# Patient Record
Sex: Male | Born: 1975 | Race: Black or African American | Hispanic: No | Marital: Married | Smoking: Never smoker
Health system: Southern US, Community
[De-identification: ages and names within clinical notes are randomized; demographics above are authoritative.]

## PROBLEM LIST (undated history)

## (undated) DIAGNOSIS — J45909 Unspecified asthma, uncomplicated: Secondary | ICD-10-CM

## (undated) DIAGNOSIS — E785 Hyperlipidemia, unspecified: Secondary | ICD-10-CM

## (undated) DIAGNOSIS — M549 Dorsalgia, unspecified: Secondary | ICD-10-CM

## (undated) HISTORY — DX: Hyperlipidemia, unspecified: E78.5

---

## 1998-11-17 ENCOUNTER — Emergency Department (HOSPITAL_COMMUNITY): Admission: EM | Admit: 1998-11-17 | Discharge: 1998-11-17 | Payer: Self-pay | Admitting: Emergency Medicine

## 2000-10-23 ENCOUNTER — Encounter: Admission: RE | Admit: 2000-10-23 | Discharge: 2000-10-23 | Payer: Self-pay | Admitting: Family Medicine

## 2000-10-23 ENCOUNTER — Encounter: Payer: Self-pay | Admitting: Family Medicine

## 2005-04-26 ENCOUNTER — Emergency Department (HOSPITAL_COMMUNITY): Admission: EM | Admit: 2005-04-26 | Discharge: 2005-04-26 | Payer: Self-pay | Admitting: Family Medicine

## 2007-05-19 ENCOUNTER — Emergency Department (HOSPITAL_COMMUNITY): Admission: EM | Admit: 2007-05-19 | Discharge: 2007-05-19 | Payer: Self-pay | Admitting: Emergency Medicine

## 2009-10-18 ENCOUNTER — Emergency Department (HOSPITAL_COMMUNITY): Admission: EM | Admit: 2009-10-18 | Discharge: 2009-10-18 | Payer: Self-pay | Admitting: Family Medicine

## 2010-05-04 LAB — POCT RAPID STREP A (OFFICE): Streptococcus, Group A Screen (Direct): NEGATIVE

## 2011-01-07 ENCOUNTER — Emergency Department (INDEPENDENT_AMBULATORY_CARE_PROVIDER_SITE_OTHER)
Admission: EM | Admit: 2011-01-07 | Discharge: 2011-01-07 | Disposition: A | Discharge status: Home / Self Care | Payer: 59 | Source: Home / Self Care | Attending: Family Medicine | Admitting: Family Medicine

## 2011-01-07 DIAGNOSIS — M542 Cervicalgia: Secondary | ICD-10-CM

## 2011-01-07 DIAGNOSIS — M62838 Other muscle spasm: Secondary | ICD-10-CM

## 2011-01-07 HISTORY — DX: Dorsalgia, unspecified: M54.9

## 2011-01-07 MED ORDER — HYDROCODONE-ACETAMINOPHEN 5-325 MG PO TABS: ORAL_TABLET | ORAL | Status: AC

## 2011-01-07 MED ORDER — CYCLOBENZAPRINE HCL 5 MG PO TABS: 5.0000 mg | ORAL_TABLET | Freq: Three times a day (TID) | ORAL | Status: AC | PRN

## 2011-01-07 MED ORDER — NAPROXEN 500 MG PO TABS: 500.0000 mg | ORAL_TABLET | Freq: Two times a day (BID) | ORAL | Status: AC

## 2011-01-07 NOTE — ED Provider Notes (Signed)
History     CSN: [id number] Arrival date & time: 01/07/2011  3:09 PM   First MD Initiated Contact with Patient 01/07/11 1503      Chief Complaint  Patient presents with  . Torticollis  . Sore Throat    (Consider location/radiation/quality/duration/timing/severity/associated sxs/prior treatment) HPI Comments: Sean Glover presents for evaluation of persistent neck pain, with movement. He denies any injury. He does report a hx of chronic back pain for which he sees a chiropractor, and has regular "adjustments." He also reports some vision changes where he has trouble focusing over the last day or so. He also reports some lightheadedness without syncope. He denies any numbness, tingling, weakness in the upper extremities.   Patient is a 35 y.o. male presenting with pharyngitis and neck injury. The history is provided by the patient.  Sore Throat This is a new problem. The current episode started more than 1 week ago. The problem occurs constantly. The problem has not changed since onset.The symptoms are aggravated by swallowing and eating. The symptoms are relieved by nothing.  Neck Injury This is a new problem. The current episode started more than 1 week ago. The problem occurs constantly. The problem has not changed since onset.He has tried nothing for the symptoms.    Past Medical History  Diagnosis Date  . Back pain     History reviewed. No pertinent past surgical history.  No family history on file.  History  Substance Use Topics  . Smoking status: Never Smoker   . Smokeless tobacco: Not on file  . Alcohol Use: No      Review of Systems  Constitutional: Negative.   HENT: Positive for sore throat, mouth sores, trouble swallowing, neck pain and neck stiffness.   Eyes: Negative.   Respiratory: Negative.   Cardiovascular: Negative.   Gastrointestinal: Negative.   Genitourinary: Negative.   Neurological: Positive for light-headedness. Negative for dizziness, weakness and  numbness.    Allergies  Review of patient's allergies indicates no known allergies.  Home Medications   Current Outpatient Rx  Name Route Sig Dispense Refill  . EXCEDRIN BACK & BODY PO Oral Take by mouth as needed.        BP 146/91  Pulse 67  Temp(Src) 98.4 F (36.9 C) (Oral)  Resp 16  SpO2 100%  Physical Exam  Constitutional: He is oriented to person, place, and time. He appears well-developed and well-nourished.  HENT:  Head: Normocephalic and atraumatic.  Eyes: EOM are normal. Pupils are equal, round, and reactive to light.  Neck: Trachea normal. Muscular tenderness present. No spinous process tenderness present. Decreased range of motion present. No mass and no thyromegaly present.       No tenderness over the trapezius of SCM muscles; mild tenderness to palpation over occipital muscles and at base of skull; decreased range of motion with extension, none with flexion; decreased range of motion with rotation to the LEFT and the RIGHT  Musculoskeletal:       5/5 strength throughout upper extremities  Neurological: He is alert and oriented to person, place, and time.  Skin: Skin is warm and dry.    ED Course  Procedures (including critical care time)  Labs Reviewed - No data to display No results found.   No diagnosis found.    MDM          Richardo Priest, MD 01/07/11 [phone]

## 2011-01-07 NOTE — ED Notes (Signed)
C/o sorethroat for 10 days.  Also c/o pain to rt side of neck and rt shoulder for 11 days.  States he goes to chiropractor once a week normally.  Reports he went for this neck pain and then went to massage therapist.  States initially the massage made it feel better but then it became worse.

## 2011-04-20 ENCOUNTER — Encounter (HOSPITAL_COMMUNITY): Payer: Self-pay

## 2011-04-20 ENCOUNTER — Emergency Department (INDEPENDENT_AMBULATORY_CARE_PROVIDER_SITE_OTHER)
Admission: EM | Admit: 2011-04-20 | Discharge: 2011-04-20 | Disposition: A | Discharge status: Home / Self Care | Payer: 59 | Source: Home / Self Care | Attending: Family Medicine | Admitting: Family Medicine

## 2011-04-20 DIAGNOSIS — J069 Acute upper respiratory infection, unspecified: Secondary | ICD-10-CM

## 2011-04-20 MED ORDER — HYDROCOD POLST-CHLORPHEN POLST 10-8 MG/5ML PO LQCR: 5.0000 mL | Freq: Two times a day (BID) | ORAL | Status: DC

## 2011-04-20 MED ORDER — IPRATROPIUM BROMIDE 0.06 % NA SOLN: 2.0000 | Freq: Four times a day (QID) | NASAL | Status: DC

## 2011-04-20 MED ORDER — AZITHROMYCIN 250 MG PO TABS: ORAL_TABLET | ORAL | Status: AC

## 2011-04-20 NOTE — ED Notes (Signed)
Pt has sorethroat and cough that started three weeks ago.  Cough is productive and worse at hs.

## 2011-04-20 NOTE — Discharge Instructions (Signed)
Drink plenty of fluids as discussed, use medicine as prescribed, and mucinex or delsym for cough. Return or see your doctor if further problems °

## 2011-04-20 NOTE — ED Provider Notes (Signed)
History     CSN: [id number]  Arrival date & time 04/20/11  1438   First MD Initiated Contact with Patient 04/20/11 1450      Chief Complaint  Patient presents with  . Sore Throat    (Consider location/radiation/quality/duration/timing/severity/associated sxs/prior treatment) Patient is a 36 y.o. male presenting with pharyngitis. The history is provided by the patient.  Sore Throat This is a new problem. The current episode started more than 1 week ago (3 wks of persistent sx, nonsmoker, no fever.). The problem occurs constantly (worse at night  and in am.). Pertinent negatives include no chest pain and no shortness of breath.    Past Medical History  Diagnosis Date  . Back pain     History reviewed. No pertinent past surgical history.  History reviewed. No pertinent family history.  History  Substance Use Topics  . Smoking status: Never Smoker   . Smokeless tobacco: Not on file  . Alcohol Use: No      Review of Systems  Constitutional: Negative.   HENT: Positive for congestion, sore throat, rhinorrhea and postnasal drip. Negative for trouble swallowing.   Respiratory: Positive for cough. Negative for shortness of breath.   Cardiovascular: Negative for chest pain.  Gastrointestinal: Negative.   Skin: Negative.     Allergies  Review of patient's allergies indicates no known allergies.  Home Medications   Current Outpatient Rx  Name Route Sig Dispense Refill  . EXCEDRIN BACK & BODY PO Oral Take by mouth as needed.      . AZITHROMYCIN 250 MG PO TABS  Take as directed on pack until finished. 6 each 0  . HYDROCOD POLST-CPM POLST ER 10-8 MG/5ML PO LQCR Oral Take 5 mLs by mouth every 12 (twelve) hours. 115 mL 0  . IPRATROPIUM BROMIDE 0.06 % NA SOLN Nasal Place 2 sprays into the nose 4 (four) times daily. 15 mL 1  . NAPROXEN 500 MG PO TABS Oral Take 1 tablet (500 mg total) by mouth 2 (two) times daily. 30 tablet 0    BP 114/56  Pulse 68  Temp(Src) 98.9 F (37.2  C) (Oral)  Resp 17  SpO2 100%  Physical Exam  Nursing note and vitals reviewed. Constitutional: He appears well-developed and well-nourished.  HENT:  Head: Normocephalic.  Right Ear: External ear normal.  Left Ear: External ear normal.  Nose: Mucosal edema and rhinorrhea present.  Mouth/Throat: Mucous membranes are normal. Posterior oropharyngeal erythema present. No oropharyngeal exudate or posterior oropharyngeal edema.  Cardiovascular: Normal rate, regular rhythm, normal heart sounds and intact distal pulses.   Pulmonary/Chest: Effort normal and breath sounds normal.    ED Course  Procedures (including critical care time)  Labs Reviewed - No data to display No results found.   1. URI (upper respiratory infection)       MDM          Barkley Bruns, MD 04/20/11 [phone]

## 2013-05-12 ENCOUNTER — Encounter (HOSPITAL_COMMUNITY): Payer: Self-pay | Admitting: Emergency Medicine

## 2013-05-12 ENCOUNTER — Emergency Department (HOSPITAL_COMMUNITY)
Admission: EM | Admit: 2013-05-12 | Discharge: 2013-05-12 | Disposition: A | Discharge status: Home / Self Care | Payer: 59 | Source: Home / Self Care | Attending: Family Medicine | Admitting: Family Medicine

## 2013-05-12 DIAGNOSIS — J00 Acute nasopharyngitis [common cold]: Secondary | ICD-10-CM

## 2013-05-12 HISTORY — DX: Unspecified asthma, uncomplicated: J45.909

## 2013-05-12 LAB — POCT RAPID STREP A: Streptococcus, Group A Screen (Direct): NEGATIVE

## 2013-05-12 NOTE — ED Provider Notes (Signed)
CSN: [id number]     Arrival date & time 05/12/13  0917 History   First MD Initiated Contact with Patient 05/12/13 0945     Chief Complaint  Patient presents with  . Sore Throat   (Consider location/radiation/quality/duration/timing/severity/associated sxs/prior Treatment) Patient is a 38 y.o. male presenting with pharyngitis and URI. The history is provided by the patient.  Sore Throat This is a new problem. Episode onset: 3 days. The problem occurs constantly. The problem has not changed since onset.Pertinent negatives include no chest pain, no abdominal pain, no headaches and no shortness of breath. The symptoms are aggravated by swallowing.  URI Presenting symptoms: congestion, cough, rhinorrhea and sore throat   Severity:  Mild Onset quality:  Gradual Duration:  3 days Timing:  Constant Progression:  Unchanged Chronicity:  New Associated symptoms: sneezing   Associated symptoms: no headaches     Past Medical History  Diagnosis Date  . Back pain   . Asthma    History reviewed. No pertinent past surgical history. No family history on file. History  Substance Use Topics  . Smoking status: Never Smoker   . Smokeless tobacco: Not on file  . Alcohol Use: No    Review of Systems  HENT: Positive for congestion, rhinorrhea, sneezing and sore throat.   Respiratory: Positive for cough. Negative for shortness of breath.   Cardiovascular: Negative for chest pain.  Gastrointestinal: Negative for abdominal pain.  Neurological: Negative for headaches.  All other systems reviewed and are negative.    Allergies  Review of patient's allergies indicates no known allergies.  Home Medications   Current Outpatient Rx  Name  Route  Sig  Dispense  Refill  . Acetaminophen-Aspirin Buffered (EXCEDRIN BACK & BODY PO)   Oral   Take by mouth as needed.           . chlorpheniramine-HYDROcodone (TUSSIONEX PENNKINETIC ER) 10-8 MG/5ML LQCR   Oral   Take 5 mLs by mouth every 12 (twelve)  hours.   115 mL   0   . EXPIRED: ipratropium (ATROVENT) 0.06 % nasal spray   Nasal   Place 2 sprays into the nose 4 (four) times daily.   15 mL   1    BP 118/77  Pulse 80  Temp(Src) 98.6 F (37 C) (Oral)  Resp 16  SpO2 100% Physical Exam  Nursing note and vitals reviewed. Constitutional: He is oriented to person, place, and time. He appears well-developed.  HENT:  Head: Normocephalic and atraumatic.  Right Ear: Hearing, tympanic membrane, external ear and ear canal normal.  Left Ear: Hearing, tympanic membrane, external ear and ear canal normal.  Nose: Nose normal.  Mouth/Throat: Uvula is midline, oropharynx is clear and moist and mucous membranes are normal.  Eyes: Conjunctivae are normal. Right eye exhibits no discharge. Left eye exhibits no discharge. No scleral icterus.  Neck: Normal range of motion. Neck supple.  Cardiovascular: Normal rate, regular rhythm and normal heart sounds.   Pulmonary/Chest: Effort normal and breath sounds normal.  Musculoskeletal: Normal range of motion.  Lymphadenopathy:    He has no cervical adenopathy.  Neurological: He is alert and oriented to person, place, and time.  Skin: Skin is warm and dry.  Psychiatric: He has a normal mood and affect. His behavior is normal.    ED Course  Procedures (including critical care time) Labs Review Labs Reviewed  POCT RAPID STREP A ([facility] CARE ONLY)   Imaging Review No results found.   MDM   1.  Common cold    Symptomatic care at home. Rapid strep negative.    Jess Barters Seltzer, [place] 05/12/13 [phone]

## 2013-05-12 NOTE — ED Notes (Signed)
Sore throat for 3 days.  Patient has been using throat spray.  Patient concerned for a strep infection versus allergies.  Patient's daughter has been recently discharged from hospital after admission for "strep in kidneys"

## 2013-05-12 NOTE — Discharge Instructions (Signed)
Rapid step screen was negative

## 2013-05-13 NOTE — ED Provider Notes (Signed)
Medical screening examination/treatment/procedure(s) were performed by a resident physician or non-physician practitioner and as the supervising physician I was immediately available for consultation/collaboration.  Clementeen GrahamEvan Corey, MD    Rodolph BongEvan S Corey, MD 05/13/13 [phone]

## 2013-05-14 LAB — CULTURE, GROUP A STREP

## 2013-10-21 ENCOUNTER — Ambulatory Visit (INDEPENDENT_AMBULATORY_CARE_PROVIDER_SITE_OTHER): Payer: 59 | Admitting: Emergency Medicine

## 2013-10-21 VITALS — BP 120/74 | HR 57 | Temp 98.3°F | Resp 16 | Ht 70.0 in | Wt 176.0 lb

## 2013-10-21 DIAGNOSIS — G5682 Other specified mononeuropathies of left upper limb: Secondary | ICD-10-CM

## 2013-10-21 DIAGNOSIS — G5680 Other specified mononeuropathies of unspecified upper limb: Secondary | ICD-10-CM

## 2013-10-21 DIAGNOSIS — J018 Other acute sinusitis: Secondary | ICD-10-CM

## 2013-10-21 MED ORDER — AMOXICILLIN-POT CLAVULANATE 875-125 MG PO TABS: 1.0000 | ORAL_TABLET | Freq: Two times a day (BID) | ORAL | Status: DC

## 2013-10-21 MED ORDER — ACETAMINOPHEN-CODEINE #3 300-30 MG PO TABS: 1.0000 | ORAL_TABLET | ORAL | Status: DC | PRN

## 2013-10-21 MED ORDER — CYCLOBENZAPRINE HCL 10 MG PO TABS: 10.0000 mg | ORAL_TABLET | Freq: Three times a day (TID) | ORAL | Status: DC | PRN

## 2013-10-21 MED ORDER — PSEUDOEPHEDRINE-GUAIFENESIN ER 60-600 MG PO TB12: 1.0000 | ORAL_TABLET | Freq: Two times a day (BID) | ORAL | Status: AC

## 2013-10-21 MED ORDER — NAPROXEN SODIUM 550 MG PO TABS: 550.0000 mg | ORAL_TABLET | Freq: Two times a day (BID) | ORAL | Status: AC

## 2013-10-21 NOTE — Addendum Note (Signed)
Addended by: Johnnette Litter on: 10/21/2013 07:38 PM   Modules accepted: Orders

## 2013-10-21 NOTE — Patient Instructions (Signed)

## 2013-10-21 NOTE — Progress Notes (Signed)
[facility] [address], [place] [address] [phone]  Date:  10/21/2013   Name:  Sean Glover   DOB:  [birth year]   MRN:  [id number]  PCP:  No Pcp Per Pt    Chief Complaint: Back Pain and Sore Throat   History of Present Illness:  Sean Glover is a 38 y.o. very pleasant male patient who presents with the following:  Ill for a week with nasal congestion and post nasal drainage.  Sore throat.  Has a mucopurulent nasal discharge.  No fever or chills\ No cough, wheezing or shortness of breath.   Moved a bedroom set of furniture around on Saturday morning.  By Monday the pain was bad and it has progressively worsened. No direct injury. No neuro symptoms or radiation of pain.   No improvement after chiropractic manipulation. No improvement with over the counter medications or other home remedies.  Denies other complaint or health concern today.  There are no active problems to display for this patient.   Past Medical History  Diagnosis Date  . Back pain   . Asthma     History reviewed. No pertinent past surgical history.  History  Substance Use Topics  . Smoking status: Never Smoker   . Smokeless tobacco: Not on file  . Alcohol Use: No    Family History  Problem Relation Age of Onset  . Heart disease Father   . Diabetes Sister     No Known Allergies  Medication list has been reviewed and updated.  Current Outpatient Prescriptions on File Prior to Visit  Medication Sig Dispense Refill  . Acetaminophen-Aspirin Buffered (EXCEDRIN BACK & BODY PO) Take by mouth as needed.        . chlorpheniramine-HYDROcodone (TUSSIONEX PENNKINETIC ER) 10-8 MG/5ML LQCR Take 5 mLs by mouth every 12 (twelve) hours.  115 mL  0  . ipratropium (ATROVENT) 0.06 % nasal spray Place 2 sprays into the nose 4 (four) times daily.  15 mL  1   No current facility-administered medications on file prior to visit.    Review of Systems:  As per HPI, otherwise negative.     Physical Examination: Filed Vitals:   10/21/13 1901  BP: 120/74  Pulse: 57  Temp: 98.3 F (36.8 C)  Resp: 16   Filed Vitals:   10/21/13 1901  Height:  (1.778 m)  Weight: 176 lb (79.833 kg)   Body mass index is 25.25 kg/(m^2). Ideal Body Weight: Weight in (lb) to have BMI = 25: 173.9  GEN: WDWN, NAD, Non-toxic, A & O x 3 HEENT: Atraumatic, Normocephalic. Neck supple. No masses, No LAD. Ears and Nose: No external deformity. CV: RRR, No M/G/R. No JVD. No thrill. No extra heart sounds. PULM: CTA B, no wheezes, crackles, rhonchi. No retractions. No resp. distress. No accessory muscle use. ABD: S, NT, ND, +BS. No rebound. No HSM. EXTR: No c/c/e  Left tenderness medial and superior to angle of scapula  NEURO Normal gait.  PSYCH: Normally interactive. Conversant. Not depressed or anxious appearing.  Calm demeanor.   Assessment and Plan: Scapulocostal syndrome Anaprox Flexeril tyl #3 Sinusitis augmentin mucinex d  Signed,  Phillips Odor, MD

## 2014-04-14 ENCOUNTER — Encounter: Payer: 59 | Attending: Family Medicine | Admitting: *Deleted

## 2014-04-14 ENCOUNTER — Encounter: Payer: Self-pay | Admitting: *Deleted

## 2014-04-14 DIAGNOSIS — Z713 Dietary counseling and surveillance: Secondary | ICD-10-CM | POA: Diagnosis not present

## 2014-04-14 DIAGNOSIS — E785 Hyperlipidemia, unspecified: Secondary | ICD-10-CM | POA: Insufficient documentation

## 2014-04-14 NOTE — Progress Notes (Signed)
  Medical Nutrition Therapy:  Appt start time: 0900 end time:  1000.   Assessment:  Primary concerns today: Moosa is here with his wife for nutrition counseling pertaining to hyperlipidemia.  His total cholesterol is 207 mg/dl.  I met with his wife independently yesterday to discuss her prediabetes.   Saifullah's wife does the cooking and grocery shopping.  However, neither one of them actually eats much at home.  They both skip a lot of meals and if he eats, many times Vasil eats out.   He skips meals every day and admits to binge eating once a week: whole box cookies, 7 doughtnuts or whole cake   Preferred Learning Style:   Auditory  Visual   Learning Readiness:  Ready   MEDICATIONS: none   DIETARY INTAKE:  Usual eating pattern includes 1-2 meals and 0-1 snacks per day.  Everyday foods include proteins, starches.  Avoided foods include none.    24-hr recall:  B ( AM): bowl oatmeal with brown sugar (instant); might get bacon platter with eggs, 1/2 biscuit; raisin bran and apple.  Skips 2 times/week  Snk ( AM): not usually  L ( PM): skips Snk ( PM): not usually D ( PM): cookout (grilled chicken sandwich and fries); might eat cereal or chip and cookies at home Snk ( PM): not usually Beverages: water, pink lemonade  Usual physical activity: work out with wife- work out at gym 3-4 days/week.  1 mile on treadmill and lifting weights (60 minutes)  Estimated energy needs: 2400-2600 calories    Nutritional Diagnosis:  [place]-2.2 Altered nutrition-related laboratory As related to fats and fiber.  As evidenced by elevated cholesterol.    Intervention:  Nutrition counseling provided.  Discussed health-healthy recommendations such as reducing saturated and trans fats and increasing unsaturated fats and fiber from whole grains, fruits/vegetables, nuts/seeds, and beans/legumes.  Recommended cardiovascular exercise more than weight lifting exercise and recommended 3 meals/day plus snacks  instead of just 2 meals.  Discussed importance of meal planning for both xachary hambly and explained how their dietary recommendations overlap and how they can make similar changes for both of their health  Teaching Method Utilized:  Visual Auditory Hands on  Handouts given during visit include:  Heart Health medical nutrition therapy recommendations  Barriers to learning/adherence to lifestyle change: making meals a priority  Demonstrated degree of understanding via:  Teach Back   Monitoring/Evaluation:  Dietary intake, exercise, labs, and body weight prn.

## 2014-05-02 ENCOUNTER — Ambulatory Visit: Payer: 59 | Admitting: Skilled Nursing Facility1

## 2014-07-13 ENCOUNTER — Encounter (HOSPITAL_COMMUNITY): Payer: Self-pay | Admitting: Emergency Medicine

## 2014-07-13 ENCOUNTER — Emergency Department (HOSPITAL_COMMUNITY)
Admission: EM | Admit: 2014-07-13 | Discharge: 2014-07-14 | Disposition: A | Discharge status: Home / Self Care | Payer: Medicaid Other | Attending: Emergency Medicine | Admitting: Emergency Medicine

## 2014-07-13 DIAGNOSIS — J45909 Unspecified asthma, uncomplicated: Secondary | ICD-10-CM | POA: Insufficient documentation

## 2014-07-13 DIAGNOSIS — R1312 Dysphagia, oropharyngeal phase: Secondary | ICD-10-CM | POA: Diagnosis not present

## 2014-07-13 DIAGNOSIS — R070 Pain in throat: Secondary | ICD-10-CM | POA: Diagnosis present

## 2014-07-13 DIAGNOSIS — Z791 Long term (current) use of non-steroidal anti-inflammatories (NSAID): Secondary | ICD-10-CM | POA: Insufficient documentation

## 2014-07-13 DIAGNOSIS — R630 Anorexia: Secondary | ICD-10-CM | POA: Diagnosis not present

## 2014-07-13 DIAGNOSIS — Z880 Allergy status to penicillin: Secondary | ICD-10-CM | POA: Insufficient documentation

## 2014-07-13 DIAGNOSIS — Z8639 Personal history of other endocrine, nutritional and metabolic disease: Secondary | ICD-10-CM | POA: Insufficient documentation

## 2014-07-13 DIAGNOSIS — J029 Acute pharyngitis, unspecified: Secondary | ICD-10-CM | POA: Insufficient documentation

## 2014-07-13 DIAGNOSIS — Z792 Long term (current) use of antibiotics: Secondary | ICD-10-CM | POA: Insufficient documentation

## 2014-07-13 DIAGNOSIS — Z79899 Other long term (current) drug therapy: Secondary | ICD-10-CM | POA: Insufficient documentation

## 2014-07-13 NOTE — ED Notes (Signed)
Pt states earlier today he was bleeding in his throat  Pt states it was a moderate amt of blood  Pt denies injury  Pt states his throat has been uncomfortable for the past couple of weeks   Not actively bleeding at this time

## 2014-07-13 NOTE — ED Provider Notes (Signed)
CSN: [id number]     Arrival date & time 07/13/14  2145 History  This chart was scribed for non-physician provider Elpidio AnisShari Jemari Hallum, PA-C, working with Derwood KaplanAnkit Nanavati, MD, by Phillis HaggisGabriella Gaje, ED Scribe. This patient was seen in room WTR9/WTR9 and patient care was started at 11:48 PM.   Chief Complaint  Patient presents with  . throat bleeding    The history is provided by the patient. No language interpreter was used.  HPI Comments: Sean Glover is a 39 y.o. male who presents to the Emergency Department complaining of throat pain onset 9 hours ago. He states that he has noticed blood in his throat as well. He states that it was consistently bleeding for 2 hours and stopped bleeding about 5 PM. He states that his throat continues to feel very irritated. He states that he was seen by his PCP in November and was diagnosed with a sinus problem and states that he always feels like he has phlegm in his throat; he states that he has continued to be told that this was allergy related. He states that there is discomfort with eating and drinking, and states that he has not eaten or drank much water today. He denies sneezing, indigestion, trouble swallowing, or fever. Patient denies any injury to the throat.   Past Medical History  Diagnosis Date  . Back pain   . Asthma   . Hyperlipidemia    History reviewed. No pertinent past surgical history. Family History  Problem Relation Age of Onset  . Heart disease Father   . Diabetes Sister   . Hypertension Sister   . Stroke Mother    History  Substance Use Topics  . Smoking status: Never Smoker   . Smokeless tobacco: Not on file  . Alcohol Use: No    Review of Systems  Constitutional: Positive for appetite change. Negative for fever and chills.  HENT: Positive for congestion and sore throat. Negative for sneezing and trouble swallowing.    Allergies  Penicillins  Home Medications   Prior to Admission medications   Medication Sig Start Date End  Date Taking? Authorizing Provider  Acetaminophen-Aspirin Buffered (EXCEDRIN BACK & BODY PO) Take by mouth as needed.      Historical Provider, MD  acetaminophen-codeine (TYLENOL #3) 300-30 MG per tablet Take 1-2 tablets by mouth every 4 (four) hours as needed. Patient not taking: Reported on 04/14/2014 10/21/13   Carmelina DaneJeffery S Anderson, MD  amoxicillin-clavulanate (AUGMENTIN) 875-125 MG per tablet Take 1 tablet by mouth 2 (two) times daily. Patient not taking: Reported on 04/14/2014 10/21/13   Carmelina DaneJeffery S Anderson, MD  chlorpheniramine-HYDROcodone [facility](TUSSIONEX PENNKINETIC ER) 10-8 MG/5ML [facility] Take 5 mLs by mouth every 12 (twelve) hours. Patient not taking: Reported on 04/14/2014 04/20/11   Linna HoffJames D Kindl, MD  CREATINE PO Take by mouth.    Historical Provider, MD  cyclobenzaprine (FLEXERIL) 10 MG tablet Take 1 tablet (10 mg total) by mouth 3 (three) times daily as needed for muscle spasms. Patient not taking: Reported on 04/14/2014 10/21/13   Carmelina DaneJeffery S Anderson, MD  ipratropium (ATROVENT) 0.06 % nasal spray Place 2 sprays into the nose 4 (four) times daily. 04/20/11 04/19/12  Linna HoffJames D Kindl, MD  Multiple Vitamin (MULTIVITAMIN) tablet Take 1 tablet by mouth daily.    Historical Provider, MD  naproxen sodium (ANAPROX DS) 550 MG tablet Take 1 tablet (550 mg total) by mouth 2 (two) times daily with a meal. Patient not taking: Reported on 04/14/2014 10/21/13 10/21/14  Carmelina DaneJeffery S Anderson,  MD  pseudoephedrine-guaifenesin (MUCINEX D) 60-600 MG per tablet Take 1 tablet by mouth every 12 (twelve) hours. Patient not taking: Reported on 04/14/2014 10/21/13 10/21/14  Carmelina Dane, MD  psyllium (REGULOID) 0.52 G capsule Take 0.52 g by mouth daily.    Historical Provider, MD   BP 130/57 mmHg  Pulse 70  Temp(Src) 98.2 F (36.8 C) (Oral)  Resp 15  Ht  (1.803 m)  Wt 175 lb (79.379 kg)  BMI 24.42 kg/m2  SpO2 100%  Physical Exam  Constitutional: He is oriented to person, place, and time. He appears well-developed and  well-nourished. No distress.  HENT:  Head: Normocephalic and atraumatic.  Mouth/Throat: Oropharynx is clear and moist.  Eyes: Conjunctivae and EOM are normal.  Neck: Normal range of motion. Neck supple. No thyromegaly present.  Cardiovascular: Normal rate, regular rhythm and normal heart sounds.   Pulmonary/Chest: Effort normal and breath sounds normal.  Musculoskeletal: Normal range of motion. He exhibits no edema.  Neurological: He is alert and oriented to person, place, and time.  Skin: Skin is warm and dry.  Psychiatric: He has a normal mood and affect. His behavior is normal.  Nursing note and vitals reviewed.   ED Course  Procedures (including critical care time) DIAGNOSTIC STUDIES: Oxygen Saturation is 100% on room air, normal by my interpretation.    COORDINATION OF CARE: 11:52 PM-Discussed treatment plan which includes referral to ENT with pt at bedside and pt agreed to plan.   Labs Review Labs Reviewed - No data to display  Imaging Review No results found.   EKG Interpretation None      MDM   Final diagnoses:  None   1. Dysphagia  Exam is unremarkable, but with history of significant bleeding from the throat will refer to ENT for indepth inspection and evaluation.   I personally performed the services described in this documentation, which was scribed in my presence. The recorded information has been reviewed and is accurate.     Elpidio Anis, PA-C 07/15/14 [id number]  Rolan Bucco, MD 07/18/14 [phone]

## 2014-07-14 NOTE — Discharge Instructions (Signed)
CALL DR. BYERS TO SCHEDULE AN APPOINTMENT FOR FURTHER EVALUATION OF BLEEDING FROM THE THROAT. RETURN TO THE EMERGENCY DEPARTMENT IF SYMPTOMS RECUR.

## 2016-06-10 ENCOUNTER — Encounter: Payer: Self-pay | Admitting: Physician Assistant

## 2016-06-11 ENCOUNTER — Encounter (HOSPITAL_COMMUNITY): Payer: Self-pay | Admitting: *Deleted

## 2016-06-11 ENCOUNTER — Emergency Department (HOSPITAL_COMMUNITY)
Admission: EM | Admit: 2016-06-11 | Discharge: 2016-06-11 | Disposition: A | Discharge status: Home / Self Care | Payer: BLUE CROSS/BLUE SHIELD | Attending: Emergency Medicine | Admitting: Emergency Medicine

## 2016-06-11 ENCOUNTER — Emergency Department (HOSPITAL_COMMUNITY): Payer: BLUE CROSS/BLUE SHIELD

## 2016-06-11 ENCOUNTER — Encounter: Payer: Self-pay | Admitting: Physician Assistant

## 2016-06-11 ENCOUNTER — Telehealth: Payer: Self-pay | Admitting: *Deleted

## 2016-06-11 ENCOUNTER — Ambulatory Visit (INDEPENDENT_AMBULATORY_CARE_PROVIDER_SITE_OTHER): Payer: BLUE CROSS/BLUE SHIELD | Admitting: Physician Assistant

## 2016-06-11 ENCOUNTER — Encounter (INDEPENDENT_AMBULATORY_CARE_PROVIDER_SITE_OTHER): Payer: Self-pay

## 2016-06-11 VITALS — BP 134/80 | HR 61 | Ht 71.0 in | Wt 184.4 lb

## 2016-06-11 DIAGNOSIS — R0789 Other chest pain: Secondary | ICD-10-CM

## 2016-06-11 DIAGNOSIS — R9431 Abnormal electrocardiogram [ECG] [EKG]: Secondary | ICD-10-CM

## 2016-06-11 DIAGNOSIS — R0600 Dyspnea, unspecified: Secondary | ICD-10-CM

## 2016-06-11 DIAGNOSIS — R0609 Other forms of dyspnea: Secondary | ICD-10-CM | POA: Diagnosis not present

## 2016-06-11 DIAGNOSIS — J45909 Unspecified asthma, uncomplicated: Secondary | ICD-10-CM | POA: Insufficient documentation

## 2016-06-11 DIAGNOSIS — R0602 Shortness of breath: Secondary | ICD-10-CM

## 2016-06-11 DIAGNOSIS — R7989 Other specified abnormal findings of blood chemistry: Secondary | ICD-10-CM

## 2016-06-11 LAB — CBC
HCT: 41 % (ref 39.0–52.0)
Hemoglobin: 13.7 g/dL (ref 13.0–17.0)
MCH: 25.3 pg — AB (ref 26.0–34.0)
MCHC: 33.4 g/dL (ref 30.0–36.0)
MCV: 75.6 fL — ABNORMAL LOW (ref 78.0–100.0)
Platelets: 280 10*3/uL (ref 150–400)
RBC: 5.42 MIL/uL (ref 4.22–5.81)
RDW: 13.4 % (ref 11.5–15.5)
WBC: 9.8 10*3/uL (ref 4.0–10.5)

## 2016-06-11 LAB — BASIC METABOLIC PANEL
ANION GAP: 9 (ref 5–15)
BUN: 10 mg/dL (ref 6–20)
CALCIUM: 9.1 mg/dL (ref 8.9–10.3)
CO2: 24 mmol/L (ref 22–32)
CREATININE: 1.01 mg/dL (ref 0.61–1.24)
Chloride: 105 mmol/L (ref 101–111)
GLUCOSE: 86 mg/dL (ref 65–99)
Potassium: 3.4 mmol/L — ABNORMAL LOW (ref 3.5–5.1)
Sodium: 138 mmol/L (ref 135–145)

## 2016-06-11 LAB — I-STAT TROPONIN, ED: TROPONIN I, POC: 0 ng/mL (ref 0.00–0.08)

## 2016-06-11 LAB — D-DIMER, QUANTITATIVE (NOT AT ARMC): D-DIMER: 0.59 mg{FEU}/L — AB (ref 0.00–0.49)

## 2016-06-11 MED ORDER — SODIUM CHLORIDE 0.9 % IV SOLN
INTRAVENOUS | Status: DC
  Administered 2016-06-11: 22:00:00 via INTRAVENOUS

## 2016-06-11 MED ORDER — IOPAMIDOL (ISOVUE-370) INJECTION 76%
INTRAVENOUS | Status: AC
  Administered 2016-06-11: 100 mL
  Filled 2016-06-11: qty 100

## 2016-06-11 NOTE — Telephone Encounter (Signed)
Pt notified of elevated D-dimer results from earlier today. Pt has been advised per Chelsea Aus, PA he will need to have a Chest CT-A to r/o PE. Pt aware CT dept will call him and schedule test to be done today or tomorrow. Pt is agreeable to plan of care.

## 2016-06-11 NOTE — ED Provider Notes (Signed)
[facility] Provider Note   CSN: [id number] Arrival date & time: 06/11/16  1657     History   Chief Complaint Chief Complaint  Patient presents with  . Shortness of Breath    HPI Sean Glover is a 41 y.o. male.  HPI The patient was sent by the cardiologist office to be evaluated for pulmonary embolism.  Patient was seen and in the left lower heart care office today for evaluation of chest tightness and shortness of breath.  Patient started having symptoms about 3 weeks ago. This started after a drive back from [facility]. Patient denies any trouble with leg swelling. No calf pain. Patient has not been able to do his usual exercise since coming back from this vacation. He had outpatient laboratory tests performed at the cardiologist office. A D-dimer test was sent. This was elevated 0.59. Patient was sent to the emergency room to have a CT scan.  Patient denies any chest pain or shortness of breath currently. Past Medical History:  Diagnosis Date  . Asthma   . Back pain   . Hyperlipidemia     There are no active problems to display for this patient.   History reviewed. No pertinent surgical history.     Home Medications    Prior to Admission medications   Not on File    Family History Family History  Problem Relation Age of Onset  . Diabetes Sister   . Stroke Mother   . Colon polyps Mother   . Healthy Sister   . Diabetes Father   . Healthy Sister     Social History Social History  Substance Use Topics  . Smoking status: Never Smoker  . Smokeless tobacco: Never Used  . Alcohol use No     Allergies   Penicillins   Review of Systems Review of Systems  All other systems reviewed and are negative.    Physical Exam Updated Vital Signs BP 133/71   Pulse 100   Temp 98.5 F (36.9 C) (Oral)   Resp 19   SpO2 100%   Physical Exam  Constitutional: He appears well-developed and well-nourished. No distress.  HENT:  Head: Normocephalic and  atraumatic.  Right Ear: External ear normal.  Left Ear: External ear normal.  Eyes: Conjunctivae are normal. Right eye exhibits no discharge. Left eye exhibits no discharge. No scleral icterus.  Neck: Neck supple. No tracheal deviation present.  Cardiovascular: Normal rate, regular rhythm and intact distal pulses.   Pulmonary/Chest: Effort normal and breath sounds normal. No stridor. No respiratory distress. He has no wheezes. He has no rales.  Abdominal: Soft. Bowel sounds are normal. He exhibits no distension. There is no tenderness. There is no rebound and no guarding.  Musculoskeletal: He exhibits no edema or tenderness.  Neurological: He is alert. He has normal strength. No cranial nerve deficit (no facial droop, extraocular movements intact, no slurred speech) or sensory deficit. He exhibits normal muscle tone. He displays no seizure activity. Coordination normal.  Skin: Skin is warm and dry. No rash noted.  Psychiatric: He has a normal mood and affect.  Nursing note and vitals reviewed.    ED Treatments / Results  Labs (all labs ordered are listed, but only abnormal results are displayed) Labs Reviewed  BASIC METABOLIC PANEL - Abnormal; Notable for the following:       Result Value   Potassium 3.4 (*)    All other components within normal limits  CBC - Abnormal; Notable for the following:  MCV 75.6 (*)    MCH 25.3 (*)    All other components within normal limits  I-STAT TROPOININ, ED     Radiology Ct Angio Chest Pe W And/or Wo Contrast  Result Date: 06/11/2016 CLINICAL DATA:  41 year old male with shortness of breath x2 days and elevated D-dimer. EXAM: CT ANGIOGRAPHY CHEST WITH CONTRAST TECHNIQUE: Multidetector CT imaging of the chest was performed using the standard protocol during bolus administration of intravenous contrast. Multiplanar CT image reconstructions and MIPs were obtained to evaluate the vascular anatomy. CONTRAST:  100 cc Isovue 370 COMPARISON:  None.  FINDINGS: Cardiovascular: There is no cardiomegaly or pericardial effusion. The thoracic aorta appears unremarkable. The origins of the great vessels of the aortic arch appear patent. Evaluation of the pulmonary artery is is limited due to suboptimal opacification and timing of contrast. No definite central pulmonary artery embolus identified. Mediastinum/Nodes: There is no hilar or mediastinal adenopathy. Residual thymic tissue noted in the anterior mediastinum. The esophagus and the thyroid gland are grossly unremarkable. Lungs/Pleura: There is mild prominence of the posterior subpleural fat versus less likely a trace bilateral pleural effusions.The lungs are clear. There is no pneumothorax. The central airways are patent. Upper Abdomen: No acute abnormality. Musculoskeletal: No chest wall abnormality. No acute or significant osseous findings. Review of the MIP images confirms the above findings. IMPRESSION: 1. No CT evidence of central pulmonary artery embolus. 2. Prominent posterior pleural fat versus less likely trace pleural effusions. Electronically Signed   By: Elgie Collard M.D.   On: 06/11/2016 22:29    Procedures Procedures (including critical care time)  Medications Ordered in ED Medications  0.9 %  sodium chloride infusion ( Intravenous New Bag/Given 06/11/16 2146)  iopamidol (ISOVUE-370) 76 % injection (100 mLs  Contrast Given 06/11/16 2159)     Initial Impression / Assessment and Plan / ED Course  I have reviewed the triage vital signs and the nursing notes.  Pertinent labs & imaging results that were available during my care of the patient were reviewed by me and considered in my medical decision making (see chart for details).  Clinical Course as of Jun 11 2305  Tue Jun 11, 2016  2135 Laboratory tests reviewed. CT scan of the chest ordered.  [JK]    Clinical Course User Index [JK] Linwood Dibbles, MD    CT scan is negative for PE.  Labs are unremarkable.  Pt has plans to follow  up with cardiology for outpatient stress test.  Final Clinical Impressions(s) / ED Diagnoses   Final diagnoses:  Dyspnea on exertion    New Prescriptions New Prescriptions   No medications on file     Linwood Dibbles, MD 06/11/16 2307

## 2016-06-11 NOTE — Progress Notes (Addendum)
Cardiology Office Note    Date:  06/11/2016   ID:  Landin, [place] Jan 09, 1976, MRN [id number]  PCP:  Lupe Carney, MD  Cardiologist:  New   Chief Complaint: Chest tightness and shortness of breath.   History of Present Illness:   Sean Glover is a 41 y.o. male with a history of asthma and hyperlipidemia referred by primary care provider  Lupe Carney, MD @ [facility]) for chest tightness with shortness of breath.  Patient was in usual state of health up until 3 weeks ago when he started to having shortness of breath and left-sided chest tightness after he drive back from [place] ruled out. It was 14 hour drip with only one stopped for few minutes. He has a constant left-sided chest pressure which exacerbates by taking deep breath and laying down. No fever, chills, syncope, dizziness, lower extremity edema, palpitation, melena or blood in his stool or urine. No calf or leg pain.  He used to exercise 3-4 times per week. He was doing cardio for 10-15 minutes and lifting afterwards. He hasn't taken any exertional activity/exercise since he came back from vacation. Denies any history of tobacco or illicit drug abuse. No cardiac history as a child. Family history of CAD or blood clot or bleeding disorder. Mother had a stroke at age 79.  Past Medical History:  Diagnosis Date  . Asthma   . Back pain   . Hyperlipidemia     No past surgical history on file.  Current Medications: Prior to Admission medications   Medication Sig Start Date End Date Taking? Authorizing Provider  Acetaminophen-Aspirin Buffered (EXCEDRIN BACK & BODY PO) Take by mouth as needed.      Historical Provider, MD  CREATINE PO Take by mouth.    Historical Provider, MD  cyclobenzaprine (FLEXERIL) 10 MG tablet Take 1 tablet (10 mg total) by mouth 3 (three) times daily as needed for muscle spasms. Patient not taking: Reported on 04/14/2014 10/21/13   Carmelina Dane, MD  Multiple Vitamin (MULTIVITAMIN)  tablet Take 1 tablet by mouth daily.    Historical Provider, MD  psyllium (REGULOID) 0.52 G capsule Take 0.52 g by mouth daily.    Historical Provider, MD    Allergies:   Penicillins   Social History   Social History  . Marital status: Married    Spouse name: N/A  . Number of children: N/A  . Years of education: N/A   Social History Main Topics  . Smoking status: Never Smoker  . Smokeless tobacco: Never Used  . Alcohol use No  . Drug use: No  . Sexual activity: Not Asked   Other Topics Concern  . None   Social History Narrative  . None     Family History:  The patient's family history includes Colon polyps in his mother; Diabetes in his father and sister; Healthy in his sister and sister; Stroke in his mother.   ROS:   Please see the history of present illness.    ROS All other systems reviewed and are negative.   PHYSICAL EXAM:   VS:  BP 134/80 (BP Location: Left Arm)   Pulse 61   Ht  (1.803 m)   Wt 184 lb 6.4 oz (83.6 kg)   BMI 25.72 kg/m    GEN: Well nourished, well developed, in no acute distress  HEENT: normal  Neck: no JVD, carotid bruits, or masses Cardiac: RRR; no murmurs, rubs, or gallops,no edema  Respiratory:  clear to  auscultation bilaterally, normal work of breathing GI: soft, nontender, nondistended, + BS MS: no deformity or atrophy  Skin: warm and dry, no rash Neuro:  Alert and Oriented x 3, Strength and sensation are intact Psych: euthymic mood, full affect  Wt Readings from Last 3 Encounters:  06/11/16 184 lb 6.4 oz (83.6 kg)  07/13/14 175 lb (79.4 kg)  10/21/13 176 lb (79.8 kg)      Studies/Labs Reviewed:   EKG:  EKG is ordered today.  The ekg ordered today demonstrates Normal sinus rhythm with T-wave inversion in lead 3 and aVL. EKG at PCP office 05/28/16 - normal sinus rhythm.  Recent Labs: No results found for requested labs within last 8760 hours.   Lipid Panel No results found for: CHOL, TRIG, HDL, CHOLHDL, VLDL,  LDLCALC, LDLDIRECT  Additional studies/ records that were reviewed today include:      ASSESSMENT & PLAN:    1. Shortness of breath with chest tightness - Pleuritis in nature. Worse with deep breath and laying down that has been started after he drove from [place]. No family history of clotting disorder or CAD. - Suspicious for PE. Will get stat d-dimer --> if positive,  get CT angiography chest; if negative, stress test (POET). - EKG today shows new T-wave inversion in leads 3 and aVF compared to EKG from PCP. Advise not to exercise until cleared. If worsening of symptoms go to ER. Patient agreed with plan. .No calf or leg pain.  Plan discussed with DOD - Dr. Ladona Ridgel.   Medication Adjustments/Labs and Tests Ordered: Current medicines are reviewed at length with the patient today.  Concerns regarding medicines are outlined above.  Medication changes, Labs and Tests ordered today are listed in the Patient Instructions below. Patient Instructions  Medication Instructions:  Your physician recommends that you continue on your current medications as directed. Please refer to the Current Medication list given to you today.   Labwork: STAT D-DIMER TODAY  Testing/Procedures: NONE ORDERED  Follow-Up: AS NEEDED AT THIS TIME  Any Other Special Instructions Will Be Listed Below (If Applicable).     If you need a refill on your cardiac medications before your next appointment, please call your pharmacy.      Lorelei Pont, [place]  06/11/2016 10:27 AM    [facility] [address] [place], [place], [place]  [address] Phone: [phone]; Fax: [phone]

## 2016-06-11 NOTE — Telephone Encounter (Signed)
-----   Message from [place], [place] sent at 06/11/2016 11:16 AM EDT ----- Will get Ct angio of chest.

## 2016-06-11 NOTE — Patient Instructions (Addendum)
Medication Instructions:  Your physician recommends that you continue on your current medications as directed. Please refer to the Current Medication list given to you today.   Labwork: STAT D-DIMER TODAY  Testing/Procedures: NONE ORDERED  Follow-Up: AS NEEDED AT THIS TIME  Any Other Special Instructions Will Be Listed Below (If Applicable). If you need a refill on your cardiac medications before your next appointment, please call your pharmacy.

## 2016-06-11 NOTE — Discharge Instructions (Signed)
Follow up with the cardiologist as planned °

## 2016-06-11 NOTE — ED Triage Notes (Signed)
Pt was sent here for eval of possible Pulmonary embolism. Pt reports SOB. Pt was sent by his cardiologist for eval

## 2016-06-12 ENCOUNTER — Inpatient Hospital Stay: Admission: RE | Admit: 2016-06-12 | Payer: BLUE CROSS/BLUE SHIELD | Source: Ambulatory Visit

## 2016-06-12 NOTE — Progress Notes (Signed)
Addendum: Pt was supposed to have Chest Ct-A today based on elevated D-dimer yesterday per Chelsea Aus, PA. Pt s/w CT dept yesterday and was agreeable to plan of care for CT to be done this morning 06/12/16 @ 8:30. Ct Dept called me this morning and asked why did we send the pt to the ED for the CT since he was scheduled this morning. I explained we did not send pt to the ED for Ct. Pt was advised myself 06/11/16 see phone note that CT dept will call him for an appt. Looks like pt went to the ED on his own and said that we sent him over there for the CT, though we did not. I just want to make sure that it is noted correctly as to what our plan of care for the pt was.   I advised Vin Bhagat, PA this morning that CT was done in the ED yesterday and is negative for PE. Per PA he asked to schedule pt now for ETT Myoview. PA explained he discussed this with the pt at OV yesterday and pt is agreeable to plan of care. I will place order for the Myoview and call the pt to let him know that [facility] will call him to schedule Myoview. Per Chelsea Aus, PA he would like Myoview to be done the same day Dr. Okey Dupre is in the office.

## 2016-06-12 NOTE — Addendum Note (Signed)
Addended by: Tarri Fuller on: 06/12/2016 09:08 AM   Modules accepted: Orders

## 2016-06-13 ENCOUNTER — Telehealth (HOSPITAL_COMMUNITY): Payer: Self-pay | Admitting: *Deleted

## 2016-06-13 NOTE — Telephone Encounter (Signed)
Patient given detailed instructions per Myocardial Perfusion Study Information Sheet for the test on 06/17/16. Patient notified to arrive 15 minutes early and that it is imperative to arrive on time for appointment to keep from having the test rescheduled.  If you need to cancel or reschedule your appointment, please call the office within 24 hours of your appointment. Failure to do so may result in a cancellation of your appointment, and a $50 no show fee. Patient verbalized understanding. Sean Glover    

## 2016-06-17 ENCOUNTER — Ambulatory Visit (HOSPITAL_COMMUNITY): Payer: BLUE CROSS/BLUE SHIELD | Attending: Cardiology

## 2016-06-17 DIAGNOSIS — R9431 Abnormal electrocardiogram [ECG] [EKG]: Secondary | ICD-10-CM | POA: Diagnosis not present

## 2016-06-17 DIAGNOSIS — R0789 Other chest pain: Secondary | ICD-10-CM | POA: Diagnosis not present

## 2016-06-17 DIAGNOSIS — R0602 Shortness of breath: Secondary | ICD-10-CM

## 2016-06-17 LAB — MYOCARDIAL PERFUSION IMAGING
CHL CUP NUCLEAR SDS: 0
CHL CUP RESTING HR STRESS: 67 {beats}/min
CSEPEDS: 0 s
CSEPHR: 98 %
Estimated workload: 13.4 METS
Exercise duration (min): 12 min
LV sys vol: 78 mL
LVDIAVOL: 144 mL (ref 62–150)
MPHR: 181 {beats}/min
Peak HR: 179 {beats}/min
RATE: 0.28
SRS: 1
SSS: 1
TID: 1.01

## 2016-06-17 MED ORDER — TECHNETIUM TC 99M TETROFOSMIN IV KIT
32.7000 | PACK | Freq: Once | INTRAVENOUS | Status: AC | PRN
  Administered 2016-06-17: 32.7 via INTRAVENOUS
  Filled 2016-06-17: qty 33

## 2016-06-17 MED ORDER — TECHNETIUM TC 99M TETROFOSMIN IV KIT
10.5000 | PACK | Freq: Once | INTRAVENOUS | Status: AC | PRN
  Administered 2016-06-17: 10.5 via INTRAVENOUS
  Filled 2016-06-17: qty 11

## 2016-06-18 ENCOUNTER — Telehealth: Payer: Self-pay | Admitting: *Deleted

## 2016-06-18 DIAGNOSIS — R9430 Abnormal result of cardiovascular function study, unspecified: Secondary | ICD-10-CM

## 2016-06-18 NOTE — Telephone Encounter (Signed)
-----   Message from [place], [place] sent at 06/17/2016  4:09 PM EDT ----- No ischemia noted on stress test. Low risk study. EF of 46% by stress test (not accurate way to measure). Will get 2D echo for complete evaluation.

## 2016-06-24 ENCOUNTER — Ambulatory Visit (HOSPITAL_COMMUNITY): Payer: BLUE CROSS/BLUE SHIELD | Attending: Cardiology

## 2016-06-24 ENCOUNTER — Other Ambulatory Visit: Payer: Self-pay

## 2016-06-24 DIAGNOSIS — R9430 Abnormal result of cardiovascular function study, unspecified: Secondary | ICD-10-CM | POA: Insufficient documentation

## 2017-02-13 ENCOUNTER — Emergency Department (HOSPITAL_COMMUNITY): Payer: BLUE CROSS/BLUE SHIELD

## 2017-02-13 ENCOUNTER — Emergency Department (HOSPITAL_COMMUNITY)
Admission: EM | Admit: 2017-02-13 | Discharge: 2017-02-14 | Disposition: A | Discharge status: Home / Self Care | Payer: BLUE CROSS/BLUE SHIELD | Attending: Emergency Medicine | Admitting: Emergency Medicine

## 2017-02-13 ENCOUNTER — Ambulatory Visit (HOSPITAL_COMMUNITY)
Admission: EM | Admit: 2017-02-13 | Discharge: 2017-02-13 | Disposition: A | Discharge status: Home / Self Care | Payer: BLUE CROSS/BLUE SHIELD | Source: Home / Self Care | Attending: Internal Medicine | Admitting: Internal Medicine

## 2017-02-13 ENCOUNTER — Encounter (HOSPITAL_COMMUNITY): Payer: Self-pay | Admitting: Emergency Medicine

## 2017-02-13 ENCOUNTER — Other Ambulatory Visit: Payer: Self-pay

## 2017-02-13 DIAGNOSIS — R0602 Shortness of breath: Secondary | ICD-10-CM

## 2017-02-13 DIAGNOSIS — J45909 Unspecified asthma, uncomplicated: Secondary | ICD-10-CM | POA: Insufficient documentation

## 2017-02-13 DIAGNOSIS — R0789 Other chest pain: Secondary | ICD-10-CM | POA: Diagnosis not present

## 2017-02-13 DIAGNOSIS — R079 Chest pain, unspecified: Secondary | ICD-10-CM | POA: Diagnosis not present

## 2017-02-13 LAB — CBC
HCT: 43.3 % (ref 39.0–52.0)
Hemoglobin: 14.6 g/dL (ref 13.0–17.0)
MCH: 26.5 pg (ref 26.0–34.0)
MCHC: 33.7 g/dL (ref 30.0–36.0)
MCV: 78.6 fL (ref 78.0–100.0)
PLATELETS: 304 10*3/uL (ref 150–400)
RBC: 5.51 MIL/uL (ref 4.22–5.81)
RDW: 14 % (ref 11.5–15.5)
WBC: 11.6 10*3/uL — ABNORMAL HIGH (ref 4.0–10.5)

## 2017-02-13 LAB — BASIC METABOLIC PANEL
Anion gap: 8 (ref 5–15)
BUN: 11 mg/dL (ref 6–20)
CALCIUM: 9.4 mg/dL (ref 8.9–10.3)
CO2: 28 mmol/L (ref 22–32)
CREATININE: 1.2 mg/dL (ref 0.61–1.24)
Chloride: 102 mmol/L (ref 101–111)
GFR calc Af Amer: >60 mL/min (ref >60)
GLUCOSE: 105 mg/dL — AB (ref 65–99)
Potassium: 3.3 mmol/L — ABNORMAL LOW (ref 3.5–5.1)
Sodium: 138 mmol/L (ref 135–145)

## 2017-02-13 LAB — I-STAT TROPONIN, ED: TROPONIN I, POC: 0.01 ng/mL (ref 0.00–0.08)

## 2017-02-13 NOTE — ED Triage Notes (Signed)
Patient reports intermittent central chest pain worse with exertion and mild SOB onset yesterday , denies nausea or diaphoresis .

## 2017-02-13 NOTE — ED Provider Notes (Signed)
[facility]    CSN: [id number] Arrival date & time: 02/13/17  1936     History   Chief Complaint Chief Complaint  Patient presents with  . Chest Pain    HPI Sean Glover is a 41 y.o. male.   Sean Glover presents with complaints of chest pressure and shortness of breath, started yesterday. Worse with activity or laying down. States he actively works out but right now if he walks he becomes easily short of breath. Feels palpitations at times. Right arm and neck feel numb. Without significant uri symptoms. Had similar incident 05/2016 with negative work up, had stress testing with cardiology. Has not seen since. Without recent travel. Has not taken any medications for symptoms. Without history of hypertension or diabetes. Does not smoke.    ROS per HPI.       Past Medical History:  Diagnosis Date  . Asthma   . Back pain   . Hyperlipidemia     There are no active problems to display for this patient.   History reviewed. No pertinent surgical history.     Home Medications    Prior to Admission medications   Not on File    Family History Family History  Problem Relation Age of Onset  . Diabetes Sister   . Stroke Mother   . Colon polyps Mother   . Healthy Sister   . Diabetes Father   . Healthy Sister     Social History Social History   Tobacco Use  . Smoking status: Never Smoker  . Smokeless tobacco: Never Used  Substance Use Topics  . Alcohol use: No  . Drug use: No     Allergies   Penicillins   Review of Systems Review of Systems   Physical Exam Triage Vital Signs ED Triage Vitals  Enc Vitals Group     BP 02/13/17 2100 131/63     Pulse Rate 02/13/17 2100 83     Resp 02/13/17 2100 18     Temp 02/13/17 2100 98 F (36.7 C)     Temp Source 02/13/17 2100 Oral     SpO2 02/13/17 2100 100 %     Weight --      Height --      Head Circumference --      Peak Flow --      Pain Score 02/13/17 2057 2     Pain Loc --      Pain  Edu? --      Excl. in GC? --    No data found.  Updated Vital Signs BP 131/63 (BP Location: Left Arm)   Pulse 83   Temp 98 F (36.7 C) (Oral)   Resp 18   SpO2 100%   Visual Acuity Right Eye Distance:   Left Eye Distance:   Bilateral Distance:    Right Eye Near:   Left Eye Near:    Bilateral Near:     Physical Exam   UC Treatments / Results  Labs (all labs ordered are listed, but only abnormal results are displayed) Labs Reviewed - No data to display  EKG  EKG Interpretation None       Radiology No results found.  Procedures Procedures (including critical care time)  Medications Ordered in UC Medications - No data to display   Initial Impression / Assessment and Plan / UC Course  I have reviewed the triage vital signs and the nursing notes.  Pertinent labs & imaging results that were available during my  care of the patient were reviewed by me and considered in my medical decision making (see chart for details).     Physical exam deferred, patient history concerning for possible acs, recommended emergency visit for troponin draw. ekg without acute changes concerning for stemi at this time, patient safe for self transport to ER. Patient verbalized understanding and agreeable to plan.    Final Clinical Impressions(s) / UC Diagnoses   Final diagnoses:  Chest pressure  Shortness of breath    ED Discharge Orders    None       Controlled Substance Prescriptions [place] Controlled Substance Registry consulted? Not Applicable   Georgetta HaberBurky, Jesica Goheen B, NP 02/13/17 2123

## 2017-02-13 NOTE — ED Triage Notes (Addendum)
Noticed symptoms yesterday.  Patient has chest pain and sob and right arm numbness.  No nausea or vomiting.  No pain with sitting, only with moving  Patient has had similar episodes and had a cardiac work up , but says there was something irregular, but no recommendations.  After all tests was told he was ok.

## 2017-02-13 NOTE — Discharge Instructions (Signed)
Please go to the ER for further evaluation of your chest pressure and shortness of breath

## 2017-02-14 LAB — I-STAT TROPONIN, ED: Troponin i, poc: 0 ng/mL (ref 0.00–0.08)

## 2017-02-14 LAB — BRAIN NATRIURETIC PEPTIDE: B NATRIURETIC PEPTIDE 5: 18 pg/mL (ref 0.0–100.0)

## 2017-02-14 LAB — D-DIMER, QUANTITATIVE: D-Dimer, Quant: <0.27 ug/mL-FEU (ref 0.00–0.50)

## 2017-02-14 MED ORDER — ALBUTEROL SULFATE HFA 108 (90 BASE) MCG/ACT IN AERS: 2.0000 | INHALATION_SPRAY | RESPIRATORY_TRACT | 0 refills | Status: AC | PRN

## 2017-02-14 MED ORDER — POTASSIUM CHLORIDE CRYS ER 20 MEQ PO TBCR
40.0000 meq | EXTENDED_RELEASE_TABLET | Freq: Once | ORAL | Status: AC
  Administered 2017-02-14: 40 meq via ORAL
  Filled 2017-02-14: qty 2

## 2017-02-14 MED ORDER — PREDNISONE 20 MG PO TABS
60.0000 mg | ORAL_TABLET | Freq: Once | ORAL | Status: AC
  Administered 2017-02-14: 60 mg via ORAL
  Filled 2017-02-14: qty 3

## 2017-02-14 MED ORDER — PREDNISONE 50 MG PO TABS: 50.0000 mg | ORAL_TABLET | Freq: Every day | ORAL | 0 refills | Status: AC

## 2017-02-14 MED ORDER — IPRATROPIUM-ALBUTEROL 0.5-2.5 (3) MG/3ML IN SOLN
3.0000 mL | Freq: Once | RESPIRATORY_TRACT | Status: AC
  Administered 2017-02-14: 3 mL via RESPIRATORY_TRACT
  Filled 2017-02-14: qty 3

## 2017-02-14 NOTE — ED Provider Notes (Signed)
[facility] EMERGENCY DEPARTMENT Provider Note   CSN: [id number] Arrival date & time: 02/13/17  2143     History   Chief Complaint Chief Complaint  Patient presents with  . Chest Pain  . Shortness of Breath    HPI Sean Glover is a 41 y.o. male.  The history is provided by the patient.  Yesterday, he started noticing exertional dyspnea with going up steps.  This is gotten worse to where now he got short of breath on walking as little as 30 feet on flat ground.  There was some associated tightness in the right side of his chest.  He denies nausea or diaphoresis.  There is been a slight cough.  He had similar symptoms in April and had extensive evaluation including CT angiogram of the chest, stress test, echocardiogram and no cause was found.  He denies any recent travel this time-last time it occurred following driving back from [place].  He is a non-smoker without any history of diabetes or hypertension or hyperlipidemia and, and there is no family history of premature coronary atherosclerosis.  He denies fever or chills.  Denies arthralgias or myalgias.  He went to urgent care and was referred here for further evaluation.  Past Medical History:  Diagnosis Date  . Asthma   . Back pain   . Hyperlipidemia     There are no active problems to display for this patient.   History reviewed. No pertinent surgical history.     Home Medications    Prior to Admission medications   Not on File    Family History Family History  Problem Relation Age of Onset  . Diabetes Sister   . Stroke Mother   . Colon polyps Mother   . Healthy Sister   . Diabetes Father   . Healthy Sister     Social History Social History   Tobacco Use  . Smoking status: Never Smoker  . Smokeless tobacco: Never Used  Substance Use Topics  . Alcohol use: No  . Drug use: No     Allergies   Penicillins   Review of Systems Review of Systems  All other systems reviewed and  are negative.    Physical Exam Updated Vital Signs BP (!) 124/58 (BP Location: Left Arm)   Pulse 69   Temp 98.8 F (37.1 C) (Oral)   Resp 18   SpO2 100%   Physical Exam  Nursing note and vitals reviewed.  41 year old male, resting comfortably and in no acute distress. Vital signs are normal. Oxygen saturation is 100%, which is normal. Head is normocephalic and atraumatic. PERRLA, EOMI. Oropharynx is clear. Neck is nontender and supple without adenopathy or JVD. Back is nontender and there is no CVA tenderness. Lungs are clear without rales, wheezes, or rhonchi. Chest is nontender. Heart has regular rate and rhythm without murmur. Abdomen is soft, flat, nontender without masses or hepatosplenomegaly and peristalsis is normoactive. Extremities have no cyanosis or edema, full range of motion is present. Skin is warm and dry without rash. Neurologic: Mental status is normal, cranial nerves are intact, there are no motor or sensory deficits.  ED Treatments / Results  Labs (all labs ordered are listed, but only abnormal results are displayed) Labs Reviewed  BASIC METABOLIC PANEL - Abnormal; Notable for the following components:      Result Value   Potassium 3.3 (*)    Glucose, Bld 105 (*)    All other components within normal limits  CBC - Abnormal; Notable for the following components:   WBC 11.6 (*)    All other components within normal limits  D-DIMER, QUANTITATIVE (NOT [facility])  BRAIN NATRIURETIC PEPTIDE  I-STAT TROPONIN, ED  I-STAT TROPONIN, ED    EKG Normal sinus rhythm 83 bpm.  Normal axis.  Normal intervals.  Incomplete right bundle branch block present.  Normal ST and T wave.  Compared with ECG from earlier today, no significant changes seen.  Radiology Dg Chest 2 View  Result Date: 02/13/2017 CLINICAL DATA:  Chest pain EXAM: CHEST  2 VIEW COMPARISON:  CT 06/11/2016 FINDINGS: The heart size and mediastinal contours are within normal limits. Both lungs are  clear. The visualized skeletal structures are unremarkable. IMPRESSION: No active cardiopulmonary disease. Electronically Signed   By: Jasmine PangKim  Fujinaga M.D.   On: 02/13/2017 22:21    Procedures Procedures (including critical care time)  Medications Ordered in ED Medications  predniSONE (DELTASONE) tablet 60 mg (not administered)  potassium chloride SA (K-DUR,KLOR-CON) CR tablet 40 mEq (40 mEq Oral Given 02/14/17 0154)  ipratropium-albuterol (DUONEB) 0.5-2.5 (3) MG/3ML nebulizer solution 3 mL (3 mLs Nebulization Given 02/14/17 0154)     Initial Impression / Assessment and Plan / ED Course  I have reviewed the triage vital signs and the nursing notes.  Pertinent labs & imaging results that were available during my care of the patient were reviewed by me and considered in my medical decision making (see chart for details).  Dyspnea and chest discomfort of uncertain cause.  ECG is normal as is troponin and chest x-ray.  Old records are reviewed confirming evaluation for chest discomfort in April-at that time, he had normal CT angiogram of the chest, normal nuclear stress test, normal echocardiogram.  He has no risk factors for coronary artery disease.  Will give therapeutic trial of albuterol with ipratropium, check repeat troponin and BNP.  Troponin, BNP, d-dimer are all unremarkable.  Potassium is slightly low and is given a dose of oral potassium.  He is not on any diuretics, and should not need ongoing potassium supplementation.  He had excellent symptomatic improvement with albuterol with ipratropium.  He was ambulated in the ED showing no dyspnea and no oxygen desaturation.  I suspect his dyspnea is related to bronchospasm -possibly related to viral illness.  He is given a dose of prednisone and is discharged with prescription for prednisone and albuterol inhaler.  Follow-up with PCP as needed.  Final Clinical Impressions(s) / ED Diagnoses   Final diagnoses:  Shortness of breath    ED  Discharge Orders        Ordered    predniSONE (DELTASONE) 50 MG tablet  Daily     02/14/17 0354    albuterol (PROVENTIL HFA;VENTOLIN HFA) 108 (90 Base) MCG/ACT inhaler  Every 4 hours PRN     02/14/17 0354       Dione BoozeGlick, Ludmilla Mcgillis, MD 02/14/17 [phone]

## 2017-02-14 NOTE — ED Notes (Signed)
Walked pt in hall, O2 stayed at 100 the whole time.

## 2018-11-25 ENCOUNTER — Other Ambulatory Visit: Payer: Self-pay

## 2018-11-25 DIAGNOSIS — Z20822 Contact with and (suspected) exposure to covid-19: Secondary | ICD-10-CM

## 2018-11-26 LAB — NOVEL CORONAVIRUS, NAA: SARS-CoV-2, NAA: NOT DETECTED

## 2018-12-29 IMAGING — CT CT ANGIO CHEST
2 of 6 series · 18 of 36 positions shown · IV contrast (Omni 300)
Comparison: None.

CLINICAL DATA: 40-year-old male with shortness of breath x2 days
and elevated D-dimer.

EXAM:
CT ANGIOGRAPHY CHEST WITH CONTRAST
TECHNIQUE: Multidetector CT imaging of the chest was performed using the
standard protocol during bolus administration of intravenous
contrast. Multiplanar CT image reconstructions and MIPs were
obtained to evaluate the vascular anatomy.
CONTRAST:  100 cc Isovue 370

[Series 7: pe thins · axial · 0.66mm/px · z∈[+1062,+1334]mm · 17 of 307 slices shown]
[im 18/307  lung]
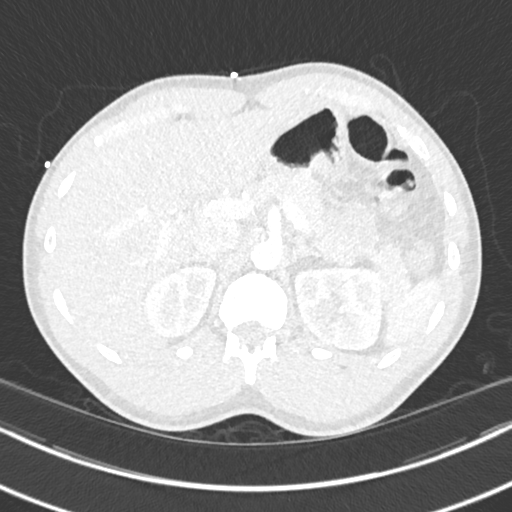
[im 35/307  mediastinal]
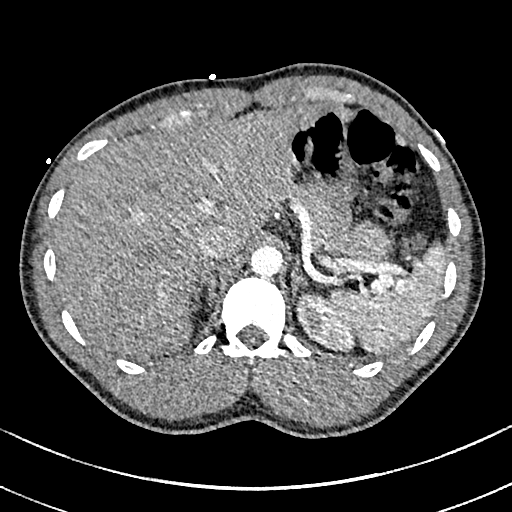
[im 52/307  lung]
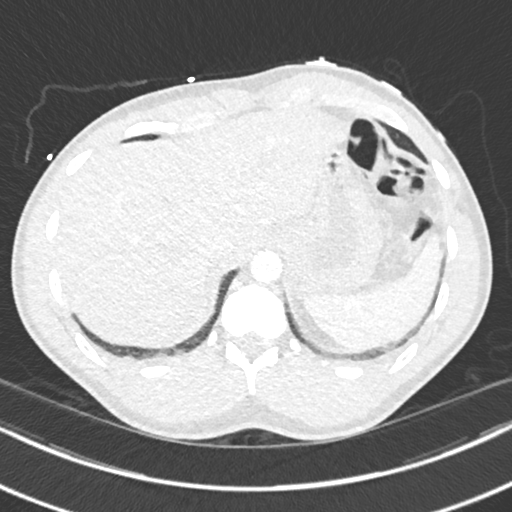
[im 69/307  mediastinal]
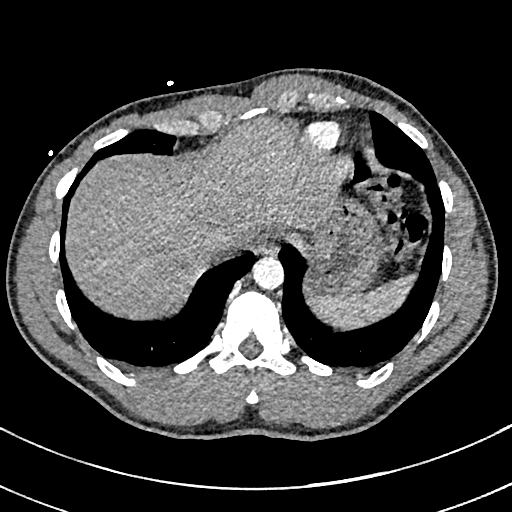
[im 86/307  lung]
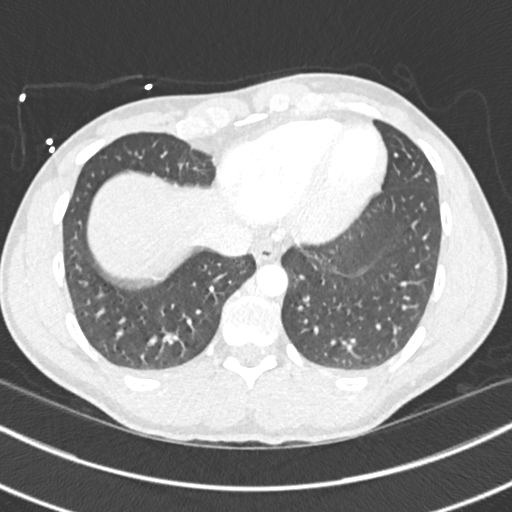
[im 103/307  mediastinal]
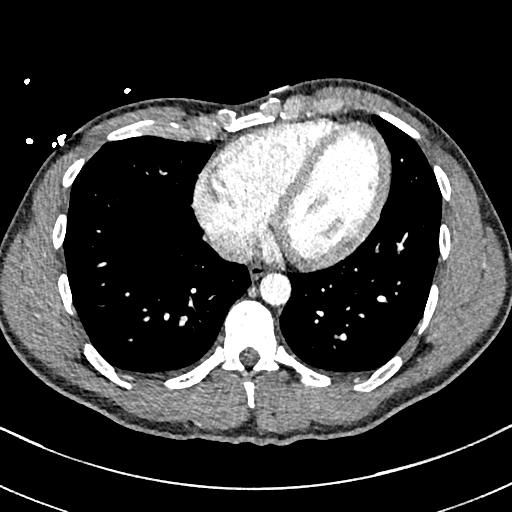
[im 120/307  lung]
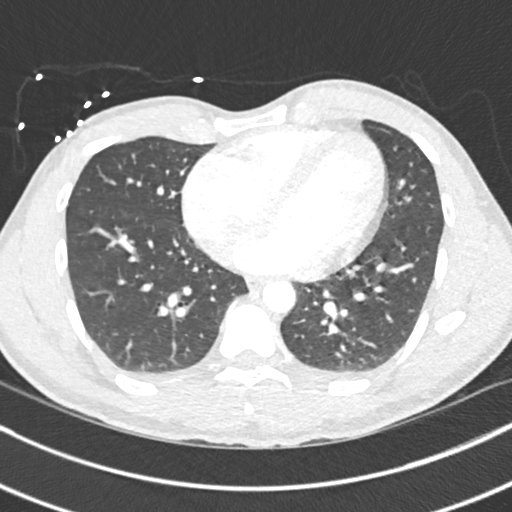
[im 137/307  mediastinal]
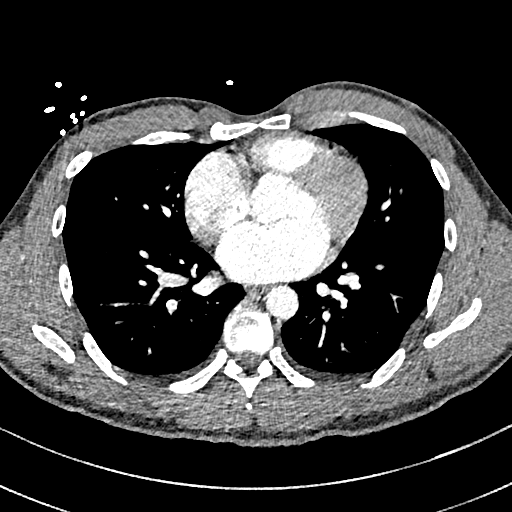
[im 154/307  lung]
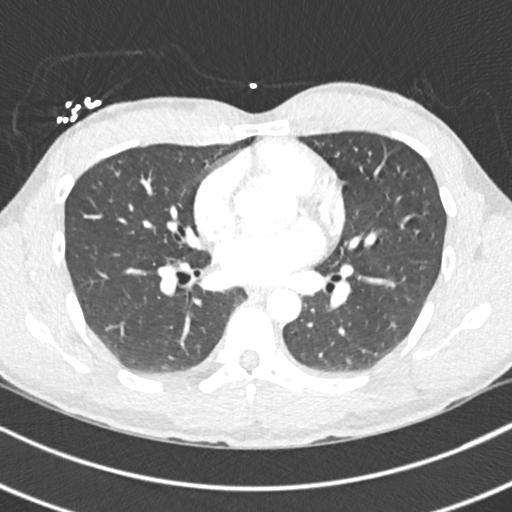
[im 171/307  mediastinal]
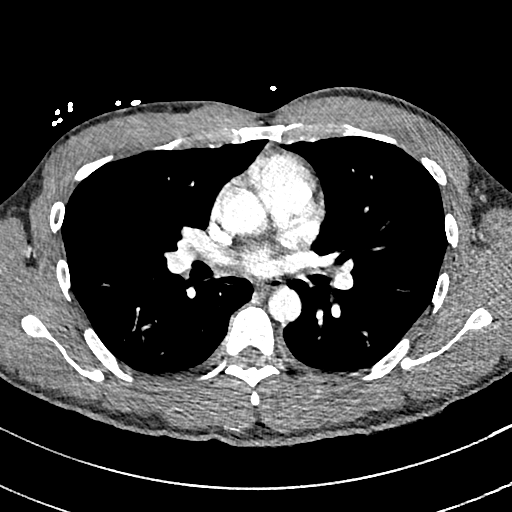
[im 188/307  lung]
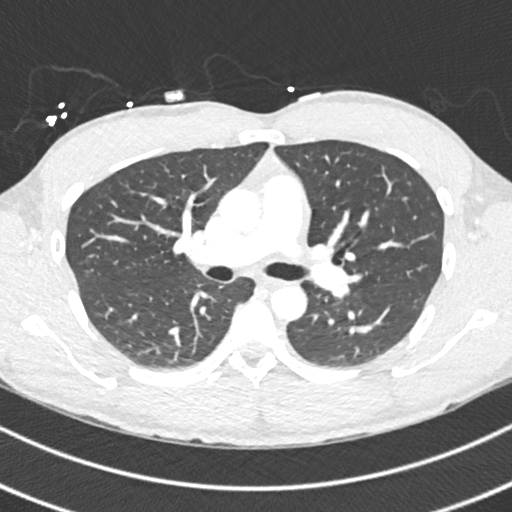
[im 205/307  mediastinal]
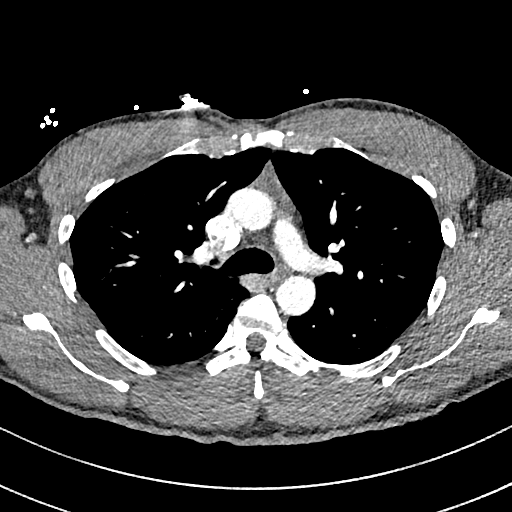
[im 222/307  lung]
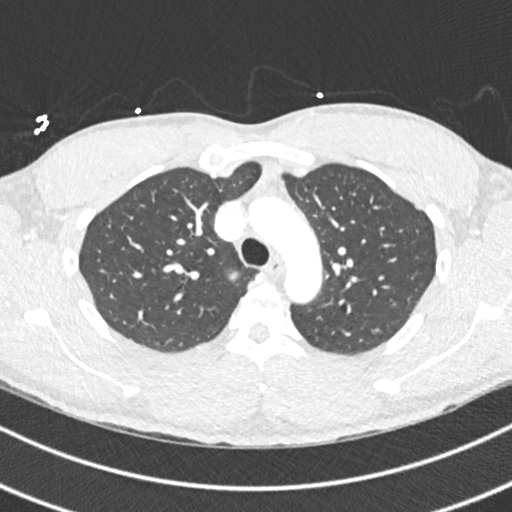
[im 239/307  mediastinal]
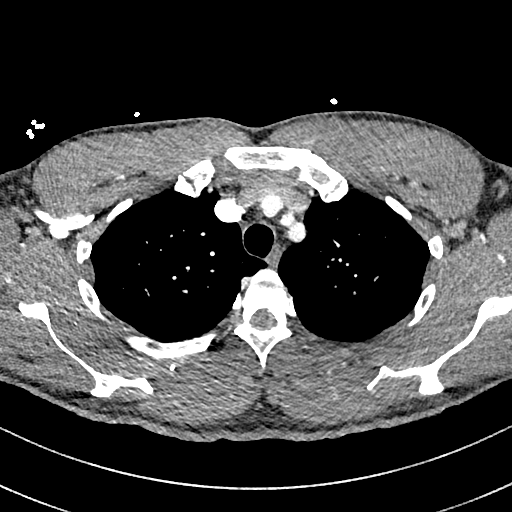
[im 256/307  lung]
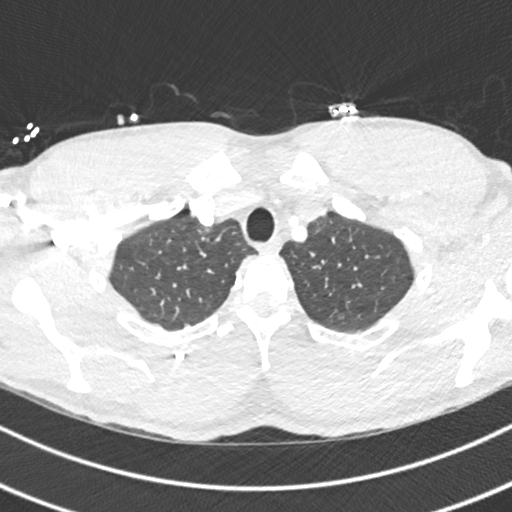
[im 273/307  mediastinal]
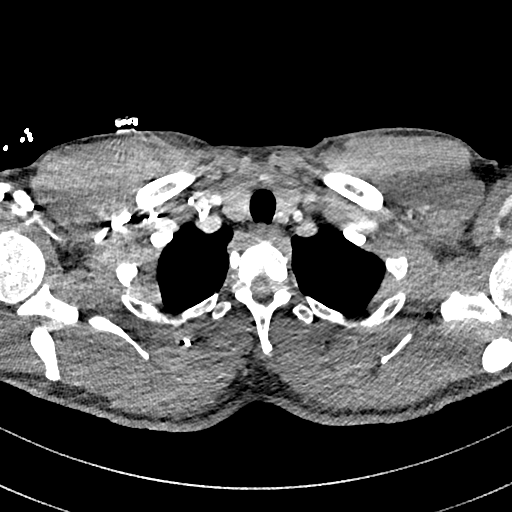
[im 290/307  lung]
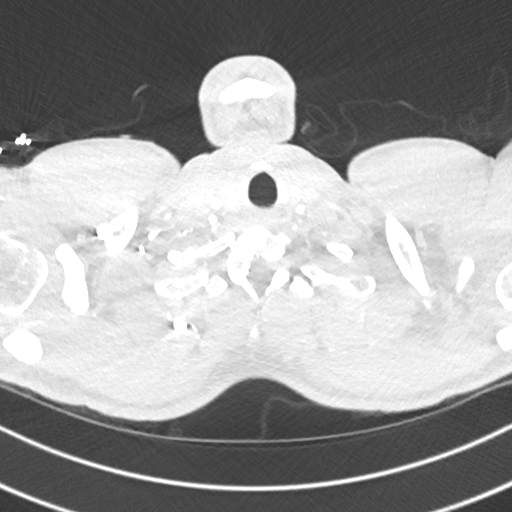

[Series 8: pe 2mm cor · coronal · 0.60mm/px · 1 of 133 slices shown]
[im 67/133  mediastinal]
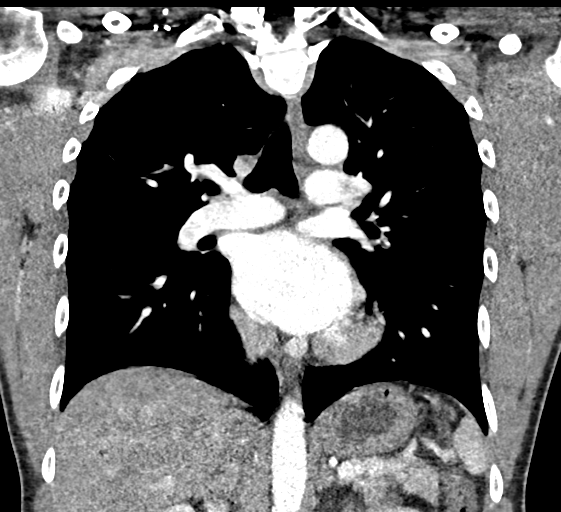

[18 of 36 positions shown; findings below may reference images not displayed]

FINDINGS: Cardiovascular: There is no cardiomegaly or pericardial effusion.
The thoracic aorta appears unremarkable. The origins of the great
vessels of the aortic arch appear patent. Evaluation of the
pulmonary artery is is limited due to suboptimal opacification and
timing of contrast. No definite central pulmonary artery embolus
identified.

Mediastinum/Nodes: There is no hilar or mediastinal adenopathy.
Residual thymic tissue noted in the anterior mediastinum. The
esophagus and the thyroid gland are grossly unremarkable.

Lungs/Pleura: There is mild prominence of the posterior subpleural
fat versus less likely a trace bilateral pleural effusions.The lungs
are clear. There is no pneumothorax. The central airways are patent.

Upper Abdomen: No acute abnormality.

Musculoskeletal: No chest wall abnormality. No acute or significant
osseous findings.

Review of the MIP images confirms the above findings.
IMPRESSION: 1. No CT evidence of central pulmonary artery embolus.
2. Prominent posterior pleural fat versus less likely trace pleural
effusions.

## 2019-01-12 ENCOUNTER — Other Ambulatory Visit: Payer: Self-pay

## 2019-01-12 DIAGNOSIS — Z20822 Contact with and (suspected) exposure to covid-19: Secondary | ICD-10-CM

## 2019-01-13 LAB — NOVEL CORONAVIRUS, NAA: SARS-CoV-2, NAA: NOT DETECTED

## 2019-02-23 ENCOUNTER — Other Ambulatory Visit: Payer: Self-pay

## 2019-02-23 ENCOUNTER — Ambulatory Visit: Payer: Self-pay | Attending: Internal Medicine

## 2019-02-23 DIAGNOSIS — Z20822 Contact with and (suspected) exposure to covid-19: Secondary | ICD-10-CM | POA: Insufficient documentation

## 2019-02-25 LAB — NOVEL CORONAVIRUS, NAA: SARS-CoV-2, NAA: NOT DETECTED

## 2019-03-10 ENCOUNTER — Ambulatory Visit: Payer: Self-pay | Attending: Internal Medicine

## 2019-03-10 DIAGNOSIS — Z20822 Contact with and (suspected) exposure to covid-19: Secondary | ICD-10-CM | POA: Insufficient documentation

## 2019-03-11 LAB — NOVEL CORONAVIRUS, NAA: SARS-CoV-2, NAA: NOT DETECTED

## 2019-03-31 ENCOUNTER — Ambulatory Visit: Payer: Self-pay | Attending: Internal Medicine

## 2019-03-31 DIAGNOSIS — Z20822 Contact with and (suspected) exposure to covid-19: Secondary | ICD-10-CM

## 2019-03-31 DIAGNOSIS — U071 COVID-19: Secondary | ICD-10-CM | POA: Insufficient documentation

## 2019-04-01 LAB — NOVEL CORONAVIRUS, NAA: SARS-CoV-2, NAA: DETECTED — AB

## 2019-04-21 ENCOUNTER — Ambulatory Visit: Payer: Self-pay | Attending: Internal Medicine

## 2019-04-21 DIAGNOSIS — Z20822 Contact with and (suspected) exposure to covid-19: Secondary | ICD-10-CM | POA: Insufficient documentation

## 2019-04-22 LAB — NOVEL CORONAVIRUS, NAA: SARS-CoV-2, NAA: NOT DETECTED

## 2022-07-26 DIAGNOSIS — Z566 Other physical and mental strain related to work: Secondary | ICD-10-CM | POA: Diagnosis not present

## 2022-07-26 DIAGNOSIS — R0602 Shortness of breath: Secondary | ICD-10-CM | POA: Diagnosis not present

## 2022-07-26 DIAGNOSIS — M940 Chondrocostal junction syndrome [Tietze]: Secondary | ICD-10-CM | POA: Diagnosis not present

## 2022-07-26 DIAGNOSIS — R0789 Other chest pain: Secondary | ICD-10-CM | POA: Diagnosis not present

## 2022-08-15 DIAGNOSIS — Z0189 Encounter for other specified special examinations: Secondary | ICD-10-CM | POA: Diagnosis not present

## 2022-08-15 DIAGNOSIS — E7800 Pure hypercholesterolemia, unspecified: Secondary | ICD-10-CM | POA: Diagnosis not present

## 2022-08-15 DIAGNOSIS — L299 Pruritus, unspecified: Secondary | ICD-10-CM | POA: Diagnosis not present

## 2022-08-15 DIAGNOSIS — Z125 Encounter for screening for malignant neoplasm of prostate: Secondary | ICD-10-CM | POA: Diagnosis not present

## 2022-08-15 DIAGNOSIS — Z1211 Encounter for screening for malignant neoplasm of colon: Secondary | ICD-10-CM | POA: Diagnosis not present

## 2023-07-04 ENCOUNTER — Ambulatory Visit
Admission: RE | Admit: 2023-07-04 | Discharge: 2023-07-04 | Disposition: A | Discharge status: Home / Self Care | Payer: Self-pay | Source: Ambulatory Visit

## 2023-07-04 ENCOUNTER — Other Ambulatory Visit: Payer: Self-pay
